# Patient Record
Sex: Male | Born: 1970 | Race: White | Hispanic: No | Marital: Single | Smoking: Current every day smoker
Health system: Southern US, Community
[De-identification: ages and names within clinical notes are randomized; demographics above are authoritative.]

## PROBLEM LIST (undated history)

## (undated) DIAGNOSIS — K633 Ulcer of intestine: Secondary | ICD-10-CM

## (undated) DIAGNOSIS — K529 Noninfective gastroenteritis and colitis, unspecified: Secondary | ICD-10-CM

## (undated) DIAGNOSIS — F419 Anxiety disorder, unspecified: Secondary | ICD-10-CM

---

## 2005-08-26 ENCOUNTER — Emergency Department (HOSPITAL_COMMUNITY): Admission: EM | Admit: 2005-08-26 | Discharge: 2005-08-26 | Payer: Self-pay | Admitting: Family Medicine

## 2005-09-09 ENCOUNTER — Emergency Department (HOSPITAL_COMMUNITY): Admission: EM | Admit: 2005-09-09 | Discharge: 2005-09-09 | Payer: Self-pay | Admitting: Family Medicine

## 2007-05-17 ENCOUNTER — Ambulatory Visit: Payer: Self-pay | Admitting: Gastroenterology

## 2007-05-30 ENCOUNTER — Encounter: Payer: Self-pay | Admitting: Gastroenterology

## 2007-05-30 ENCOUNTER — Ambulatory Visit: Payer: Self-pay | Admitting: Gastroenterology

## 2007-06-29 ENCOUNTER — Ambulatory Visit: Payer: Self-pay | Admitting: Gastroenterology

## 2007-12-17 ENCOUNTER — Emergency Department (HOSPITAL_COMMUNITY): Admission: EM | Admit: 2007-12-17 | Discharge: 2007-12-17 | Payer: Self-pay | Admitting: Emergency Medicine

## 2010-09-21 NOTE — Assessment & Plan Note (Signed)
[facility]                         GASTROENTEROLOGY OFFICE NOTE   ERVING, SASSANO                       MRN:          [id number]  DATE:06/29/2007                            DOB:          1971/04/23    HISTORY OF PRESENT ILLNESS:  Mr. Llamas returns for followup of left  sided ulcerative colitis and GERD. His reflux symptoms are well  controlled on Pantoprazole. He still has frequent, small volume diarrhea  and urgency but the volume of diarrhea has decreased. He notes no  further rectal bleeding.   CURRENT MEDICATIONS:  Listed on the chart, updated, and reviewed.   ALLERGIES:  NO KNOWN DRUG ALLERGIES.   PHYSICAL EXAMINATION:  GENERAL:  No acute distress. He is not re-  examined.  VITAL SIGNS:  Weight 215.4 pounds. Blood pressure 122/80, pulse 72 and  regular.   ASSESSMENT/PLAN:  1. LEFT SIDED ULCERATIVE COLITIS:  Continue Lealda 2.4 grams daily.      Begin Canasa 1000 mg suppositories q.h.s. for the next 4 weeks.      Return office visit in 4 weeks.  2. GASTROESOPHAGEAL REFLUX DISEASE:  Continue standard anti-reflux      measures and Pantoprazole 40 mg p.o. q. a.m.     Venita Lick. Russella Dar, MD, [facility]  Electronically Signed    MTS/MedQ  DD: 06/29/2007  DT: 06/30/2007  Job #: [id number]

## 2010-09-21 NOTE — Assessment & Plan Note (Signed)
[facility]                         GASTROENTEROLOGY OFFICE NOTE   Thomas Castro, Thomas Castro                       MRN:          [id number]  DATE:05/17/2007                            DOB:          08/08/1970    REQUESTING Kayveon Lennartz:  Kristian Covey, P.A.-C.   REASON FOR CONSULTATION:  Alternating diarrhea and constipation,  associated with rectal bleeding.   HISTORY OF PRESENT ILLNESS:  This 40 year old white male who relates a  several-month history of alternating diarrhea and constipation.  He  predominantly has diarrhea.  His symptoms are often exacerbated by meals  and it has been associated with bloating.  He has noted a few episodes  of small amounts of bright red blood per rectum with bowel movements.  He has had worsening problems with anxiety over the same time frame.  He  is now managed with fluvoxamine and Clonazepam with improved control of  his anxiety and also improved his bowel habits.  Around the same time he  developed problems with frequent heartburn and he was started on Zegerid  and his symptoms have responded.  He notes no dysphagia, odynophagia,  weight loss, change in stool caliber, abdominal pain or chest pain.   FAMILY HISTORY:  Mother with colon cancer at age 74.  maternal  grandmother with colon cancer in her 55's.  His brother has colon  polyps.   PAST MEDICAL HISTORY:  1. Anxiety.  2. Hyperlipidemia.  3. Hemorrhoids.   CURRENT MEDICATIONS:  Listed on the chart, updated and reviewed.   ALLERGIES:  No known drug allergies.   SOCIAL HISTORY/REVIEW OF SYSTEMS:  Per the handwritten form.   PHYSICAL EXAMINATION:  GENERAL:  Well-developed and well-nourished, in  no acute distress.  VITAL SIGNS:  Height 5 feet 11 inches, weight 214 pounds, blood pressure  120/90, pulse 80 and regular.  HEENT:  Anicteric sclerae.  Oropharynx clear.  CHEST:  Clear to auscultation bilaterally.  HEART:  A regular rate and rhythm without  murmurs appreciated.  ABDOMEN:  Soft and nontender, with normoactive bowel sounds.  No  palpable organomegaly, masses or herniae.  RECTAL:  Deferred at the time of colonoscopy.  EXTREMITIES:  Without clubbing, cyanosis or edema.  NEUROLOGIC:  Alert and oriented x3.  Grossly nonfocal.   ASSESSMENT/PLAN:  1. Change in bowel habits with alternating diarrhea and constipation      associated with small volume hematochezia.  Strong family history      of colon cancer. His symptoms have substantially improved on      fluvoxamine and clonazepam.  The risks, benefits and alternatives      to colonoscopy and possible biopsy and possible polypectomy were      discussed with the patient and he consents to proceed.  This will      be scheduled electively.  2. Gastroesophageal reflux disease:  Continue anti-reflux measures and      Zegerid q.a.m.  Schedule an upper endoscopy.  The risks, benefits      and alternatives to upper endoscopy discussed with the patient and      he consents to proceed.  This  will be scheduled electively at the      time of his colonoscopy.     Venita Lick. Russella Dar, MD, [facility]  Electronically Signed    MTS/MedQ  DD: 05/29/2007  DT: 05/29/2007  Job #: [id number]   cc:   Kristian Covey, P.A.-C.

## 2011-02-04 LAB — CBC
Hemoglobin: 16.1
RBC: 4.89
RDW: 13.5
WBC: 9.1

## 2011-02-04 LAB — DIFFERENTIAL
Basophils Relative: 1
Eosinophils Absolute: 0
Monocytes Absolute: 0.7
Monocytes Relative: 8
Neutrophils Relative %: 79 — ABNORMAL HIGH

## 2011-02-04 LAB — COMPREHENSIVE METABOLIC PANEL
ALT: 35
Alkaline Phosphatase: 49
Glucose, Bld: 128 — ABNORMAL HIGH
Potassium: 4
Sodium: 141
Total Protein: 6.1

## 2011-10-24 ENCOUNTER — Emergency Department (HOSPITAL_COMMUNITY)
Admission: EM | Admit: 2011-10-24 | Discharge: 2011-10-24 | Disposition: A | Discharge status: Home / Self Care | Payer: 59 | Attending: Emergency Medicine | Admitting: Emergency Medicine

## 2011-10-24 ENCOUNTER — Emergency Department (HOSPITAL_COMMUNITY): Payer: 59

## 2011-10-24 ENCOUNTER — Encounter (HOSPITAL_COMMUNITY): Payer: Self-pay | Admitting: Emergency Medicine

## 2011-10-24 DIAGNOSIS — K5190 Ulcerative colitis, unspecified, without complications: Secondary | ICD-10-CM | POA: Insufficient documentation

## 2011-10-24 DIAGNOSIS — R109 Unspecified abdominal pain: Secondary | ICD-10-CM | POA: Insufficient documentation

## 2011-10-24 DIAGNOSIS — E860 Dehydration: Secondary | ICD-10-CM | POA: Insufficient documentation

## 2011-10-24 DIAGNOSIS — K625 Hemorrhage of anus and rectum: Secondary | ICD-10-CM | POA: Insufficient documentation

## 2011-10-24 HISTORY — DX: Noninfective gastroenteritis and colitis, unspecified: K52.9

## 2011-10-24 HISTORY — DX: Ulcer of intestine: K63.3

## 2011-10-24 LAB — URINALYSIS, ROUTINE W REFLEX MICROSCOPIC
Bilirubin Urine: NEGATIVE
Nitrite: NEGATIVE
Protein, ur: 30 mg/dL — AB
Specific Gravity, Urine: 1.028 (ref 1.005–1.030)
Urobilinogen, UA: 0.2 mg/dL (ref 0.0–1.0)

## 2011-10-24 LAB — URINE MICROSCOPIC-ADD ON

## 2011-10-24 LAB — CBC
HCT: 44.1 % (ref 39.0–52.0)
Hemoglobin: 15.8 g/dL (ref 13.0–17.0)
MCH: 31.8 pg (ref 26.0–34.0)
MCHC: 35.8 g/dL (ref 30.0–36.0)
MCV: 88.7 fL (ref 78.0–100.0)
Platelets: 251 K/uL (ref 150–400)
RBC: 4.97 MIL/uL (ref 4.22–5.81)
RDW: 14 % (ref 11.5–15.5)
WBC: 8.1 K/uL (ref 4.0–10.5)

## 2011-10-24 LAB — BASIC METABOLIC PANEL
BUN: 11 mg/dL (ref 6–23)
CO2: 18 mEq/L — ABNORMAL LOW (ref 19–32)
Calcium: 8.1 mg/dL — ABNORMAL LOW (ref 8.4–10.5)
GFR calc non Af Amer: >90 mL/min (ref >90)
Glucose, Bld: 65 mg/dL — ABNORMAL LOW (ref 70–99)
Potassium: 3.9 mEq/L (ref 3.5–5.1)

## 2011-10-24 LAB — COMPREHENSIVE METABOLIC PANEL
Alkaline Phosphatase: 77 U/L (ref 39–117)
BUN: 13 mg/dL (ref 6–23)
Calcium: 9.1 mg/dL (ref 8.4–10.5)
GFR calc Af Amer: >90 mL/min (ref >90)
GFR calc non Af Amer: >90 mL/min (ref >90)
Glucose, Bld: 87 mg/dL (ref 70–99)
Total Protein: 6.5 g/dL (ref 6.0–8.3)

## 2011-10-24 LAB — LIPASE, BLOOD: Lipase: 21 U/L (ref 11–59)

## 2011-10-24 LAB — DIFFERENTIAL
Basophils Absolute: 0 K/uL (ref 0.0–0.1)
Basophils Relative: 0 % (ref 0–1)
Eosinophils Absolute: 0 K/uL (ref 0.0–0.7)
Eosinophils Relative: 0 % (ref 0–5)
Lymphocytes Relative: 15 % (ref 12–46)
Lymphs Abs: 1.2 K/uL (ref 0.7–4.0)
Monocytes Absolute: 0.6 K/uL (ref 0.1–1.0)
Monocytes Relative: 7 % (ref 3–12)
Neutro Abs: 6.3 K/uL (ref 1.7–7.7)
Neutrophils Relative %: 78 % — ABNORMAL HIGH (ref 43–77)

## 2011-10-24 MED ORDER — HYDROCODONE-ACETAMINOPHEN 5-325 MG PO TABS: 2.0000 | ORAL_TABLET | ORAL | Status: AC | PRN

## 2011-10-24 MED ORDER — HYDROMORPHONE HCL PF 1 MG/ML IJ SOLN
1.0000 mg | Freq: Once | INTRAMUSCULAR | Status: AC
  Administered 2011-10-24: 1 mg via INTRAVENOUS
  Filled 2011-10-24: qty 1

## 2011-10-24 MED ORDER — ONDANSETRON 4 MG PO TBDP: 4.0000 mg | ORAL_TABLET | Freq: Three times a day (TID) | ORAL | Status: AC | PRN

## 2011-10-24 MED ORDER — SODIUM CHLORIDE 0.9 % IV BOLUS (SEPSIS)
1000.0000 mL | Freq: Once | INTRAVENOUS | Status: AC
  Administered 2011-10-24: 1000 mL via INTRAVENOUS

## 2011-10-24 MED ORDER — IOHEXOL 300 MG/ML  SOLN
20.0000 mL | INTRAMUSCULAR | Status: AC
  Administered 2011-10-24 (×2): 20 mL via ORAL

## 2011-10-24 MED ORDER — ONDANSETRON HCL 4 MG/2ML IJ SOLN
4.0000 mg | Freq: Once | INTRAMUSCULAR | Status: AC
  Administered 2011-10-24: 4 mg via INTRAVENOUS
  Filled 2011-10-24: qty 2

## 2011-10-24 MED ORDER — IOHEXOL 300 MG/ML  SOLN
100.0000 mL | Freq: Once | INTRAMUSCULAR | Status: AC | PRN
  Administered 2011-10-24: 100 mL via INTRAVENOUS

## 2011-10-24 NOTE — Discharge Instructions (Signed)
Your abdominal pain and weakness likely came from your ulcerative colitis. You have been given IV fluids while in the ED. It is very important that you follow up with your gastroenterologist for further evaluation and treatment of the UC. Make sure to drink plenty of fluids over the next several days. Return to the ED for worsening or changing condition or new symptoms.  Dehydration, Adult Dehydration is when you lose more fluids from the body than you take in. Vital organs like the kidneys, brain, and heart cannot function without a proper amount of fluids and salt. Any loss of fluids from the body can cause dehydration.  CAUSES   Vomiting.   Diarrhea.   Excessive sweating.   Excessive urine output.   Fever.  SYMPTOMS  Mild dehydration  Thirst.   Dry lips.   Slightly dry mouth.  Moderate dehydration  Very dry mouth.   Sunken eyes.   Skin does not bounce back quickly when lightly pinched and released.   Dark urine and decreased urine production.   Decreased tear production.   Headache.  Severe dehydration  Very dry mouth.   Extreme thirst.   Rapid, weak pulse (more than 100 beats per minute at rest).   Cold hands and feet.   Not able to sweat in spite of heat and temperature.   Rapid breathing.   Blue lips.   Confusion and lethargy.   Difficulty being awakened.   Minimal urine production.   No tears.  DIAGNOSIS  Your caregiver will diagnose dehydration based on your symptoms and your exam. Blood and urine tests will help confirm the diagnosis. The diagnostic evaluation should also identify the cause of dehydration. TREATMENT  Treatment of mild or moderate dehydration can often be done at home by increasing the amount of fluids that you drink. It is best to drink small amounts of fluid more often. Drinking too much at one time can make vomiting worse. Refer to the home care instructions below. Severe dehydration needs to be treated at the hospital where  you will probably be given intravenous (IV) fluids that contain water and electrolytes. HOME CARE INSTRUCTIONS   Ask your caregiver about specific rehydration instructions.   Drink enough fluids to keep your urine clear or pale yellow.   Drink small amounts frequently if you have nausea and vomiting.   Eat as you normally do.   Avoid:   Foods or drinks high in sugar.   Carbonated drinks.   Juice.   Extremely hot or cold fluids.   Drinks with caffeine.   Fatty, greasy foods.   Alcohol.   Tobacco.   Overeating.   Gelatin desserts.   Wash your hands well to avoid spreading bacteria and viruses.   Only take over-the-counter or prescription medicines for pain, discomfort, or fever as directed by your caregiver.   Ask your caregiver if you should continue all prescribed and over-the-counter medicines.   Keep all follow-up appointments with your caregiver.  SEEK MEDICAL CARE IF:  You have abdominal pain and it increases or stays in one area (localizes).   You have a rash, stiff neck, or severe headache.   You are irritable, sleepy, or difficult to awaken.   You are weak, dizzy, or extremely thirsty.  SEEK IMMEDIATE MEDICAL CARE IF:   You are unable to keep fluids down or you get worse despite treatment.   You have frequent episodes of vomiting or diarrhea.   You have blood or green matter (bile) in your vomit.  You have blood in your stool or your stool looks black and tarry.   You have not urinated in 6 to 8 hours, or you have only urinated a small amount of very dark urine.   You have a fever.   You faint.  MAKE SURE YOU:   Understand these instructions.   Will watch your condition.   Will get help right away if you are not doing well or get worse.  Document Released: 04/25/2005 Document Revised: 04/14/2011 Document Reviewed: 12/13/2010 [facility] Patient Information 2012 [place], [place].Bloody Stools Bloody stools often mean that there is a problem  in the digestive tract. Your caregiver may use the term "melena" to describe black, tarry, and bad smelling stools or "hematochezia" to describe red or maroon-colored stools. Blood seen in the stool can be caused by bleeding anywhere along the intestinal tract.  A black stool usually means that blood is coming from the upper part of the gastrointestinal tract (esophagus, stomach, or small bowel). Passing maroon-colored stools or bright red blood usually means that blood is coming from lower down in the large bowel or the rectum. However, sometimes massive bleeding in the stomach or small intestine can cause bright red bloody stools.  Consuming black licorice, lead, iron pills, medicines containing bismuth subsalicylate, or blueberries can also cause black stools. Your caregiver can test black stools to see if blood is present. It is important that the cause of the bleeding be found. Treatment can then be started, and the problem can be corrected. Rectal bleeding may not be serious, but you should not assume everything is okay until you know the cause.It is very important to follow up with your caregiver or a specialist in gastrointestinal problems. CAUSES  Blood in the stools can come from various underlying causes.Often, the cause is not found during your first visit. Testing is often needed to discover the cause of bleeding in the gastrointestinal tract. Causes range from simple to serious or even life-threatening.Possible causes include:  Hemorrhoids.These are veins that are full of blood (engorged) in the rectum. They cause pain, inflammation, and may bleed.   Anal fissures.These are areas of painful tearing which may bleed. They are often caused by passing hard stool.   Diverticulosis.These are pouches that form on the colon over time, with age, and may bleed significantly.   Diverticulitis.This is inflammation in areas with diverticulosis. It can cause pain, fever, and bloody stools,  although bleeding is rare.   Proctitis and colitis. These are inflamed areas of the rectum or colon. They may cause pain, fever, and bloody stools.   Polyps and cancer. Colon cancer is a leading cause of preventable cancer death.It often starts out as precancerous polyps that can be removed during a colonoscopy, preventing progression into cancer. Sometimes, polyps and cancer may cause rectal bleeding.   Gastritis and ulcers.Bleeding from the upper gastrointestinal tract (near the stomach) may travel through the intestines and produce black, sometimes tarry, often bad smelling stools. In certain cases, if the bleeding is fast enough, the stools may not be black, but red and the condition may be life-threatening.  SYMPTOMS  You may have stools that are bright red and bloody, that are normal color with blood on them, or that are dark black and tarry. In some cases, you may only have blood in the toilet bowl. Any of these cases need medical care. You may also have:  Pain at the anus or anywhere in the rectum.   Lightheadedness or feeling faint.  Extreme weakness.   Nausea or vomiting.   Fever.  DIAGNOSIS Your caregiver may use the following methods to find the cause of your bleeding:  Taking a medical history. Age is important. Older people tend to develop polyps and cancer more often. If there is anal pain and a hard, large stool associated with bleeding, a tear of the anus may be the cause. If blood drips into the toilet after a bowel movement, bleeding hemorrhoids may be the problem. The color and frequency of the bleeding are additional considerations. In most cases, the medical history provides clues, but seldom the final answer.   A visual and finger (digital) exam. Your caregiver will inspect the anal area, looking for tears and hemorrhoids. A finger exam can provide information when there is tenderness or a growth inside. In men, the prostate is also examined.   Endoscopy. Several  types of small, long scopes (endoscopes) are used to view the colon.   In the office, your caregiver may use a rigid, or more commonly, a flexible viewing sigmoidoscope. This exam is called flexible sigmoidoscopy. It is performed in 5 to 10 minutes.   A more thorough exam is accomplished with a colonoscope. It allows your caregiver to view the entire 5 to 6 foot long colon. Medicine to help you relax (sedative) is usually given for this exam. Frequently, a bleeding lesion may be present beyond the reach of the sigmoidoscope. So, a colonoscopy may be the best exam to start with. Both exams are usually done on an outpatient basis. This means the patient does not stay overnight in the hospital or surgery center.   An upper endoscopy may be needed to examine your stomach. Sedation is used and a flexible endoscope is put in your mouth, down to your stomach.   A barium enema X-ray. This is an X-ray exam. It uses liquid barium inserted by enema into the rectum. This test alone may not identify an actual bleeding point. X-rays highlight abnormal shadows, such as those made by lumps (tumors), diverticuli, or colitis.  TREATMENT  Treatment depends on the cause of your bleeding.   For bleeding from the stomach or colon, the caregiver doing your endoscopy or colonoscopy may be able to stop the bleeding as part of the procedure.   Inflammation or infection of the colon can be treated with medicines.   Many rectal problems can be treated with creams, suppositories, or warm baths.   Surgery is sometimes needed.   Blood transfusions are sometimes needed if you have lost a lot of blood.   For any bleeding problem, let your caregiver know if you take aspirin or other blood thinners regularly.  HOME CARE INSTRUCTIONS   Take any medicines exactly as prescribed.   Keep your stools soft by eating a diet high in fiber. Prunes (1 to 3 a day) work well for many people.   Drink enough water and fluids to keep  your urine clear or pale yellow.   Take sitz baths if advised. A sitz bath is when you sit in a bathtub with warm water for 10 to 15 minutes to soak, soothe, and cleanse the rectal area.   If enemas or suppositories are advised, be sure you know how to use them. Tell your caregiver if you have problems with this.   Monitor your bowel movements to look for signs of improvement or worsening.  SEEK MEDICAL CARE IF:   You do not improve in the time expected.   Your  condition worsens after initial improvement.   You develop any new symptoms.  SEEK IMMEDIATE MEDICAL CARE IF:   You develop severe or prolonged rectal bleeding.   You vomit blood.   You feel weak or faint.   You have a fever.  MAKE SURE YOU:  Understand these instructions.   Will watch your condition.   Will get help right away if you are not doing well or get worse.  Document Released: 04/15/2002 Document Revised: 04/14/2011 Document Reviewed: 09/10/2010 [facility] Patient Information 2012 [place], [place].

## 2011-10-24 NOTE — ED Notes (Signed)
Pt. Stated, i had blood dark blood in my stool this am and for the last few weeks and my stomach has had this strange gas and I've not even eaten.

## 2011-10-24 NOTE — ED Provider Notes (Signed)
History     CSN: [id number]  Arrival date & time 10/24/11  0906   First MD Initiated Contact with Patient 10/24/11 0935      Chief Complaint  Patient presents with  . Rectal Bleeding    (Consider location/radiation/quality/duration/timing/severity/associated sxs/prior treatment) HPI Comments: Patient presents with dark tarry stools for the past one day. Has a history of ulcerative colitis that has been untreated. He was diagnosed 4 years ago and saw Dr. Russella Dar is not taking any medications and not followed up. He also complains of diffuse crampy abdominal pain that feels like gas. He denies any vomiting but complains of nausea. He has poor by mouth intake and no appetite. Denies any difficulty with urination. Had normal bowel movements up until today.  The history is provided by the patient.    Past Medical History  Diagnosis Date  . Ulcer, colon   . Colitis     History reviewed. No pertinent past surgical history.  No family history on file.  History  Substance Use Topics  . Smoking status: Current Everyday Smoker  . Smokeless tobacco: Not on file  . Alcohol Use: 1.2 oz/week    2 Cans of beer per week      Review of Systems  Constitutional: Positive for activity change, appetite change and fatigue. Negative for fever.  HENT: Negative for congestion and rhinorrhea.   Respiratory: Negative for cough, chest tightness and shortness of breath.   Cardiovascular: Negative for chest pain.  Gastrointestinal: Positive for nausea, abdominal pain, diarrhea, blood in stool and hematochezia. Negative for vomiting.  Genitourinary: Negative for dysuria.  Musculoskeletal: Negative for back pain.  Skin: Negative for rash.  Neurological: Negative for dizziness and light-headedness.    Allergies  Review of patient's allergies indicates no known allergies.  Home Medications   Current Outpatient Rx  Name Route Sig Dispense Refill  . ADULT MULTIVITAMIN W/MINERALS CH Oral Take 1  tablet by mouth daily.    . ST JOHNS WORT PO Oral Take 1 tablet by mouth daily.      BP 158/94  Pulse 86  Temp 98.7 F (37.1 C) (Oral)  Resp 16  SpO2 97%  Physical Exam  Constitutional: He is oriented to person, place, and time. He appears well-developed and well-nourished. He appears distressed.       Uncomfortable  HENT:  Head: Normocephalic and atraumatic.  Mouth/Throat: Oropharynx is clear and moist. No oropharyngeal exudate.  Eyes: Conjunctivae are normal. Pupils are equal, round, and reactive to light.  Neck: Normal range of motion. Neck supple.  Cardiovascular: Normal rate, regular rhythm and normal heart sounds.   No murmur heard. Pulmonary/Chest: Effort normal and breath sounds normal. No respiratory distress.  Abdominal: Soft. There is tenderness. There is no rebound and no guarding.  Genitourinary: Guaiac positive stool.       No hemorrhoids, no fissures  Musculoskeletal: Normal range of motion. He exhibits no edema and no tenderness.  Neurological: He is alert and oriented to person, place, and time. No cranial nerve deficit.  Skin: Skin is warm.    ED Course  Procedures (including critical care time)  Labs Reviewed  DIFFERENTIAL - Abnormal; Notable for the following:    Neutrophils Relative 78 (*)     All other components within normal limits  COMPREHENSIVE METABOLIC PANEL - Abnormal; Notable for the following:    CO2 16 (*)     AST 55 (*)     All other components within normal limits  URINALYSIS, ROUTINE  W REFLEX MICROSCOPIC - Abnormal; Notable for the following:    Ketones, ur >80 (*)     Protein, ur 30 (*)     All other components within normal limits  BASIC METABOLIC PANEL - Abnormal; Notable for the following:    CO2 18 (*)     Glucose, Bld 65 (*)     Calcium 8.1 (*)     All other components within normal limits  CBC  LIPASE, BLOOD  OCCULT BLOOD, POC DEVICE  URINE MICROSCOPIC-ADD ON  OCCULT BLOOD X 1 CARD TO LAB, STOOL   Ct Abdomen Pelvis W  Contrast  10/24/2011  *RADIOLOGY REPORT*  Clinical Data: Rectal bleeding.  History of ulcerative colitis, diagnosed on endo.  CT ABDOMEN AND PELVIS WITH CONTRAST  Technique:  Multidetector CT imaging of the abdomen and pelvis was performed following the standard protocol during bolus administration of intravenous contrast.  Contrast: OMNIPAQUE IOHEXOL 300 MG/ML  SOLN  Comparison: CT of the abdomen and pelvis 12/17/2007  Findings:  At the lung bases are unremarkable.  No focal abnormality identified within the liver, spleen, pancreas, adrenal glands, or kidneys.  The gallbladder is present.  The stomach and small bowel loops have a normal appearance. The appendix is well seen and has a normal appearance.  The colonic loops are incompletely distended but have a normal CT appearance. No inflammation or colonic mass identified.  No retroperitoneal or mesenteric adenopathy. No evidence for aortic aneurysm.  Degenerative changes are seen in the lumbar spine.  IMPRESSION: No evidence for acute abnormality of the abdomen pelvis.  Original Report Authenticated By: Patterson Hammersmith, M.D.     1. Dehydration   2. Rectal bleeding   3. Ulcerative colitis       MDM  Untreated ulcerative colitis with dark stools and diffuse crampy abdominal pain. Abdomen soft and nonsurgical. Mild tachycardia and discomfort.  IVF, labs, orthostatics, symptom control.  CT a/p given history of UC that is untreated.  Care discussed with [facility].       Glynn Octave, MD 10/24/11 1622

## 2011-10-24 NOTE — ED Provider Notes (Signed)
11:49 AM Care assumed of pt in CDU from Dr. Manus Gunning. Pt with hx of ulcerative colitis presented with bloody stools. He is hemoccult positive here and noted to be orthostatic.  Plan: -CT abd/pelvis -IVF to correct orthostasis -recheck BMP to ensure upward trend of bicarb  1:12 PM CT abd/pel with no acute findings. Pt reports that he continues to feel "weak." I explained to him that he is likely dehydrated given the positive orthostatics and >80 ketones in urine. We will continue to rehydrate and treat pain. Pt agreeable with this plan.  3:00 PM Pt remains stable with no c/o at this time.  6:00 PM Pt's bicarb is improved from previous. No longer orthostatic. He is feeling better and wishes to be discharged home. Emphasized importance of f/u with GI MD for further evaluation and treatment. Return precautions discussed.  Grant Fontana, PA-C 10/24/11 2008

## 2011-10-25 NOTE — ED Provider Notes (Signed)
Medical screening examination/treatment/procedure(s) were performed by non-physician practitioner and as supervising physician I was immediately available for consultation/collaboration.  Laelle Bridgett L Madine Sarr, MD 10/25/11 2019 

## 2012-04-10 ENCOUNTER — Encounter: Payer: Self-pay | Admitting: Gastroenterology

## 2013-01-01 ENCOUNTER — Encounter: Payer: Self-pay | Admitting: Gastroenterology

## 2013-01-14 IMAGING — CT CT ABD-PELV W/ CM
2 of 5 series · 16 of 46 positions shown, 18 images · IV contrast (APPLIED)
Comparison: CT of the abdomen and pelvis 12/17/2007

CLINICAL DATA: Rectal bleeding.  History of ulcerative colitis,
diagnosed on endo.

CT ABDOMEN AND PELVIS WITH CONTRAST
TECHNIQUE: Multidetector CT imaging of the abdomen and pelvis was
performed following the standard protocol during bolus
administration of intravenous contrast.
Contrast: 100mL OMNIPAQUE IOHEXOL 300 MG/ML  SOLN

[Series 2: abd/pelv with 5.0 b31f st · axial · 0.73mm/px · z∈[-496,-66]mm · 13 of 98 slices shown, 15 images]
[im 6/98  soft-tissue]
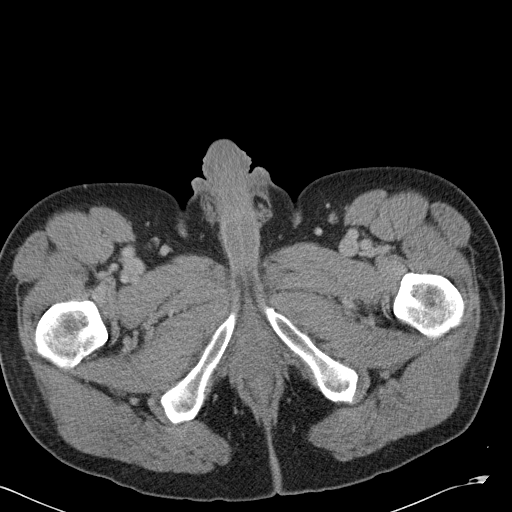
[im 6/98  bone]
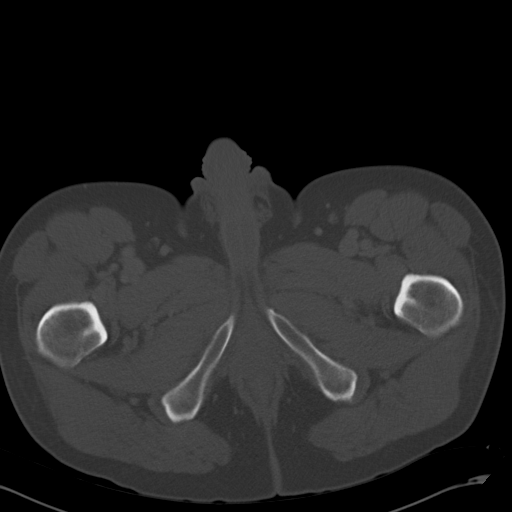
[im 11/98  soft-tissue]
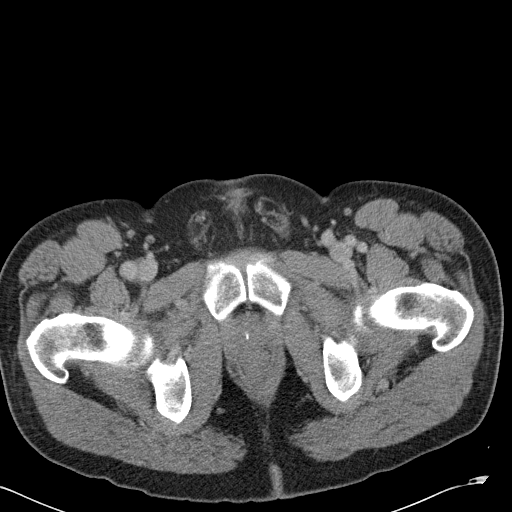
[im 22/98  soft-tissue]
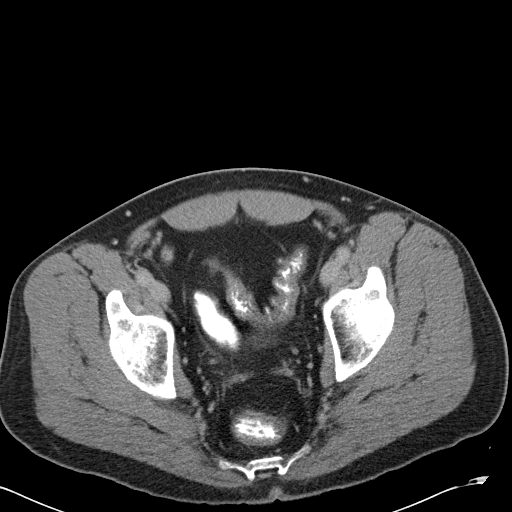
[im 27/98  soft-tissue]
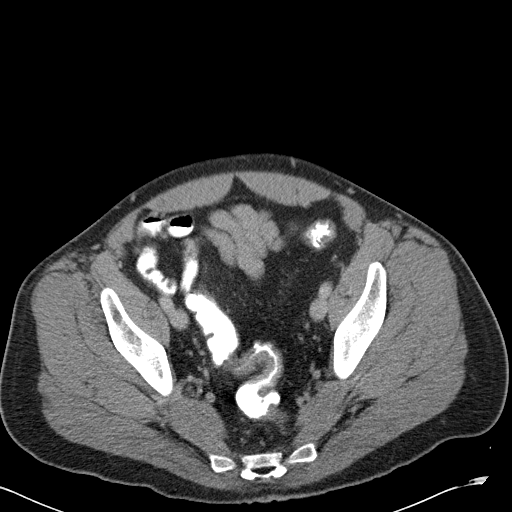
[im 33/98  soft-tissue]
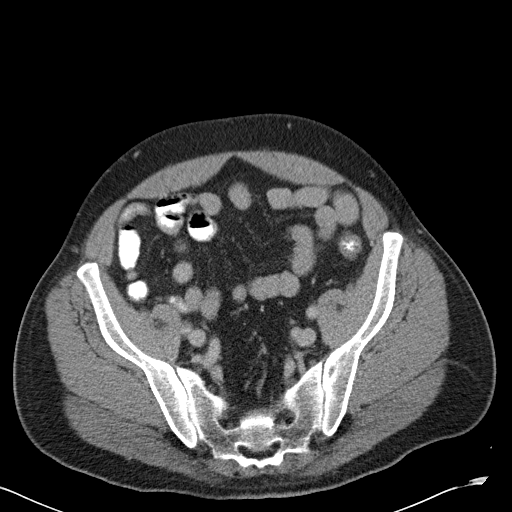
[im 44/98  soft-tissue]
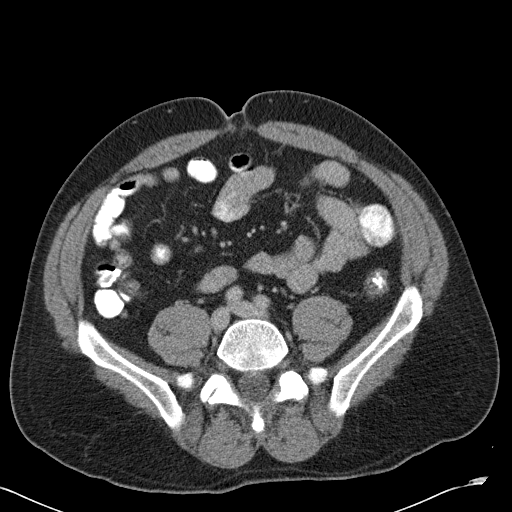
[im 49/98  soft-tissue]
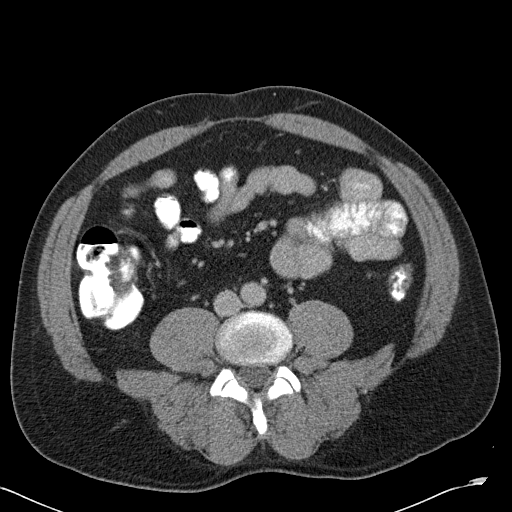
[im 54/98  soft-tissue]
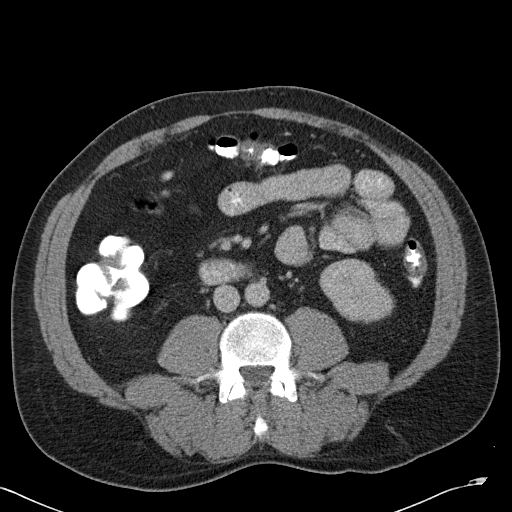
[im 65/98  soft-tissue]
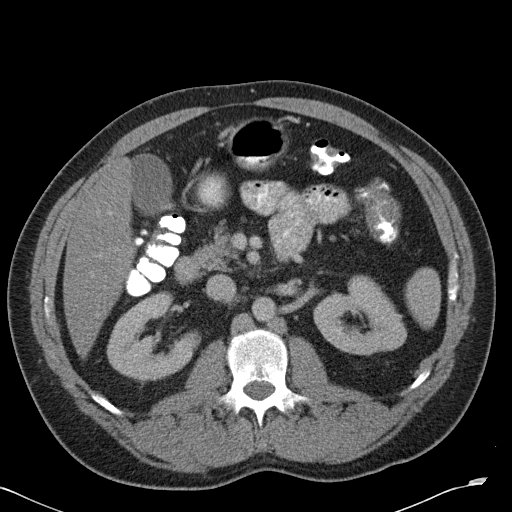
[im 65/98  bone]
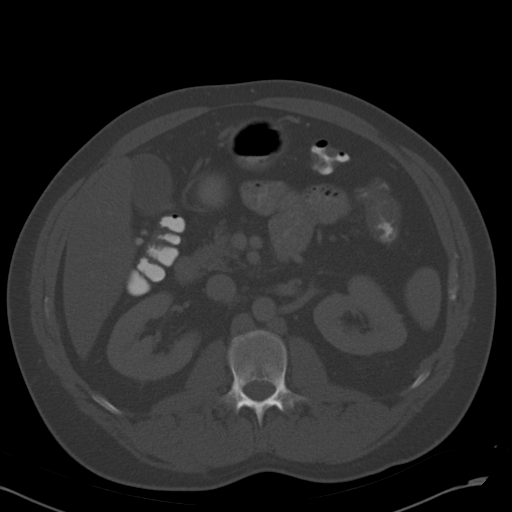
[im 71/98  soft-tissue]
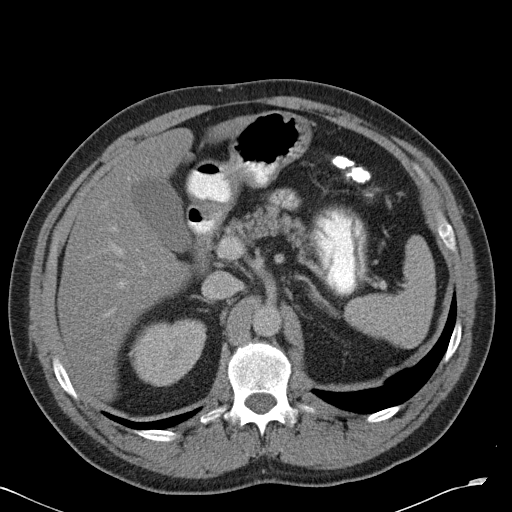
[im 76/98  soft-tissue]
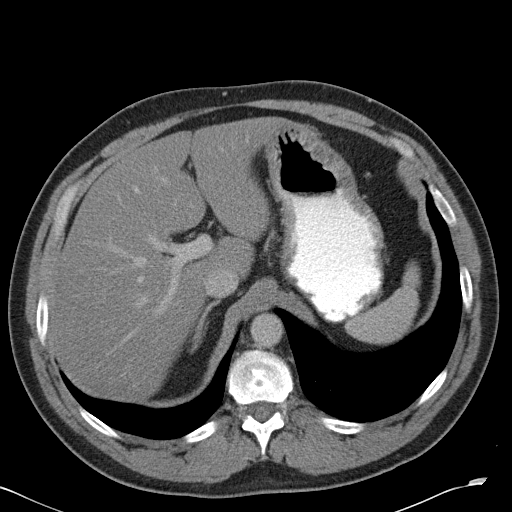
[im 87/98  soft-tissue]
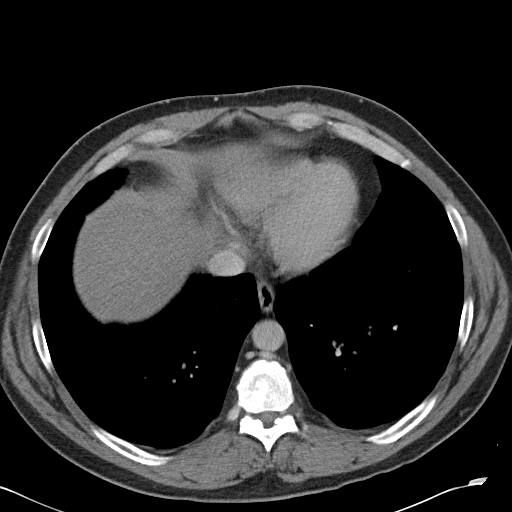
[im 92/98  soft-tissue]
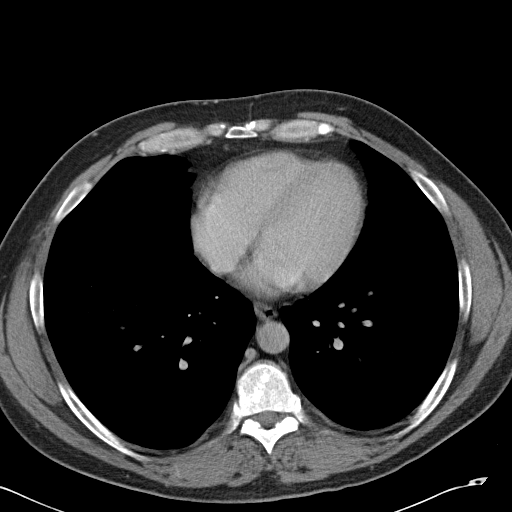

[Series 5: coronals · coronal · 0.66mm/px · 3 of 141 slices shown]
[im 47/141  soft-tissue]
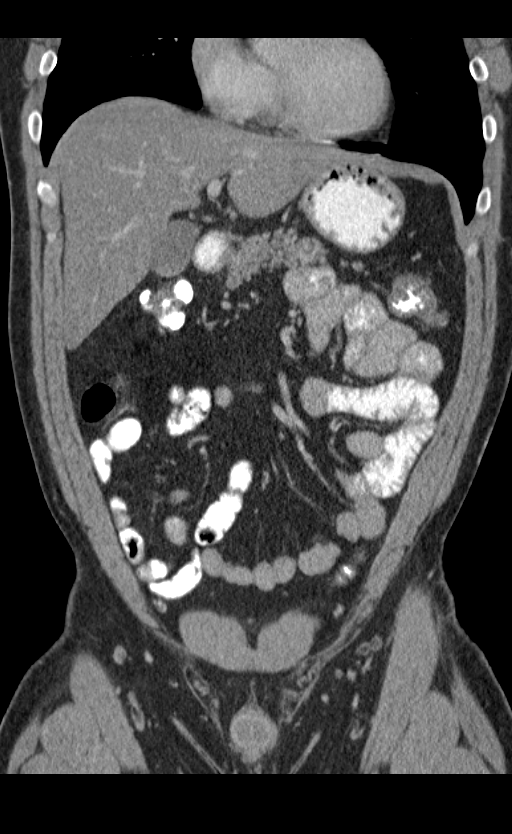
[im 63/141  soft-tissue]
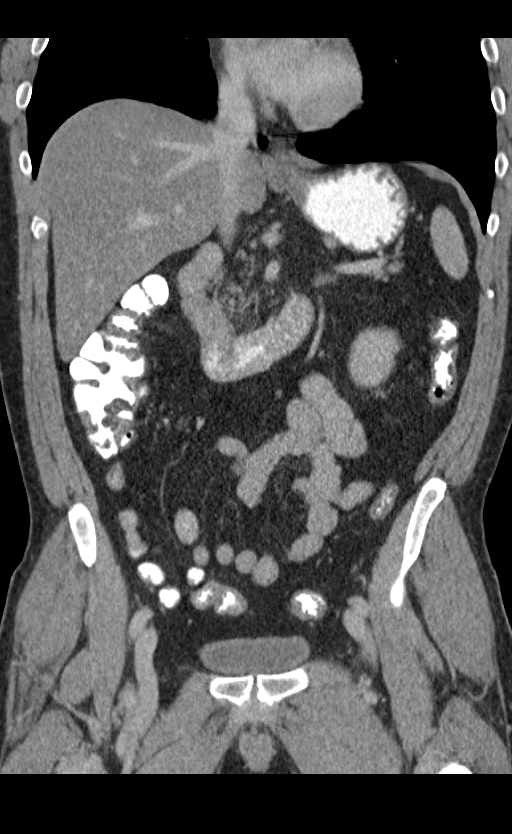
[im 78/141  soft-tissue]
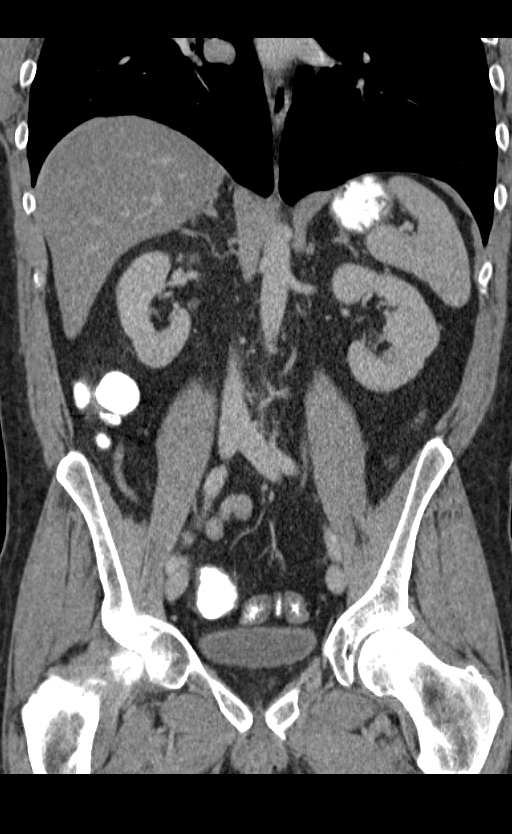

[16 of 46 positions shown; findings below may reference images not displayed]

FINDINGS: At the lung bases are unremarkable.  No focal abnormality
identified within the liver, spleen, pancreas, adrenal glands, or
kidneys.  The gallbladder is present.

The stomach and small bowel loops have a normal appearance. The
appendix is well seen and has a normal appearance.  The colonic
loops are incompletely distended but have a normal CT appearance.
No inflammation or colonic mass identified.

No retroperitoneal or mesenteric adenopathy. No evidence for aortic
aneurysm.

Degenerative changes are seen in the lumbar spine.
IMPRESSION: No evidence for acute abnormality of the abdomen pelvis.

## 2015-10-20 ENCOUNTER — Emergency Department (HOSPITAL_COMMUNITY)
Admission: EM | Admit: 2015-10-20 | Discharge: 2015-10-21 | Disposition: A | Discharge status: Home / Self Care | Payer: 59 | Attending: Emergency Medicine | Admitting: Emergency Medicine

## 2015-10-20 ENCOUNTER — Encounter (HOSPITAL_COMMUNITY): Payer: Self-pay | Admitting: Emergency Medicine

## 2015-10-20 DIAGNOSIS — R748 Abnormal levels of other serum enzymes: Secondary | ICD-10-CM

## 2015-10-20 DIAGNOSIS — F17200 Nicotine dependence, unspecified, uncomplicated: Secondary | ICD-10-CM | POA: Insufficient documentation

## 2015-10-20 DIAGNOSIS — R5383 Other fatigue: Secondary | ICD-10-CM | POA: Diagnosis not present

## 2015-10-20 DIAGNOSIS — K921 Melena: Secondary | ICD-10-CM | POA: Diagnosis present

## 2015-10-20 HISTORY — DX: Anxiety disorder, unspecified: F41.9

## 2015-10-20 LAB — COMPREHENSIVE METABOLIC PANEL
ALT: 97 U/L — ABNORMAL HIGH (ref 17–63)
AST: 308 U/L — AB (ref 15–41)
Albumin: 3.2 g/dL — ABNORMAL LOW (ref 3.5–5.0)
Alkaline Phosphatase: 93 U/L (ref 38–126)
Anion gap: 11 (ref 5–15)
BUN: 18 mg/dL (ref 6–20)
CHLORIDE: 103 mmol/L (ref 101–111)
CO2: 22 mmol/L (ref 22–32)
Calcium: 8.4 mg/dL — ABNORMAL LOW (ref 8.9–10.3)
Creatinine, Ser: 0.98 mg/dL (ref 0.61–1.24)
Glucose, Bld: 128 mg/dL — ABNORMAL HIGH (ref 65–99)
POTASSIUM: 3.6 mmol/L (ref 3.5–5.1)
SODIUM: 136 mmol/L (ref 135–145)
Total Bilirubin: 0.7 mg/dL (ref 0.3–1.2)
Total Protein: 5.4 g/dL — ABNORMAL LOW (ref 6.5–8.1)

## 2015-10-20 LAB — CBC
HEMATOCRIT: 43.7 % (ref 39.0–52.0)
Hemoglobin: 14.9 g/dL (ref 13.0–17.0)
MCH: 32.7 pg (ref 26.0–34.0)
MCHC: 34.1 g/dL (ref 30.0–36.0)
MCV: 95.8 fL (ref 78.0–100.0)
Platelets: 190 10*3/uL (ref 150–400)
RBC: 4.56 MIL/uL (ref 4.22–5.81)
RDW: 14.5 % (ref 11.5–15.5)
WBC: 3.6 10*3/uL — AB (ref 4.0–10.5)

## 2015-10-20 LAB — ABO/RH: ABO/RH(D): A NEG

## 2015-10-20 LAB — TYPE AND SCREEN
ABO/RH(D): A NEG
Antibody Screen: NEGATIVE

## 2015-10-20 NOTE — ED Notes (Signed)
Pt. reports black stools today with generalized weakness , fatigue and mild dizziness onset today .

## 2015-10-20 NOTE — ED Provider Notes (Signed)
CSN: [id number]     Arrival date & time 10/20/15  1855 History   First MD Initiated Contact with Patient 10/20/15 2333     Chief Complaint  Patient presents with  . Melena  . Fatigue     (Consider location/radiation/quality/duration/timing/severity/associated sxs/prior Treatment) HPI   Patient to the ER with multiple medical complaints. He has a PMH of colitis and anxiety. He complains of weight loss 15 lbs in past month, fatigue, black stools off and on, diarrhea (chronic), palpitations, swelling in his ankles: past year, nightmares and decreased sleep, GERD, hemoptysis all for the past month. And admits to alcohol 3 whisky drinks per day.   Overall the patients biggest complaint is that he feels tired and nauseous as well as intermittent melena. No BRB per rectum or active bleeding. Patient multiple times denied that he has any abdominal pain at any point.  Past Medical History  Diagnosis Date  . Ulcer, colon   . Colitis   . Anxiety    History reviewed. No pertinent past surgical history. No family history on file. Social History  Substance Use Topics  . Smoking status: Current Every Day Smoker  . Smokeless tobacco: None  . Alcohol Use: Yes    Review of Systems  Review of Systems All other systems negative except as documented in the HPI. All pertinent positives and negatives as reviewed in the HPI.   Allergies  Review of patient's allergies indicates no known allergies.  Home Medications   Prior to Admission medications   Medication Sig Start Date End Date Taking? Authorizing Provider  [facility] PO Take 1 tablet by mouth daily.   Yes Historical Provider, MD  ondansetron (ZOFRAN) 4 MG tablet Take 1 tablet (4 mg total) by mouth every 6 (six) hours. 10/21/15   Namiyah Grantham Neva SeatGreene, PA-C   BP 146/89 mmHg  Pulse 86  Temp(Src) 98.4 F (36.9 C) (Oral)  Resp 16  Ht 6' (1.829 m)  Wt 83.008 kg  BMI 24.81 kg/m2  SpO2 94% Physical Exam  Constitutional: He appears  well-developed and well-nourished. No distress.  HENT:  Head: Normocephalic and atraumatic.  Right Ear: Tympanic membrane and ear canal normal.  Left Ear: Tympanic membrane and ear canal normal.  Nose: Nose normal.  Mouth/Throat: Uvula is midline, oropharynx is clear and moist and mucous membranes are normal.  Eyes: Pupils are equal, round, and reactive to light.  Neck: Normal range of motion. Neck supple.  Cardiovascular: Normal rate and regular rhythm.   Pulmonary/Chest: Effort normal.  Abdominal: Soft. Bowel sounds are normal. There is no tenderness. There is no rigidity, no rebound, no guarding and no CVA tenderness.  No signs of abdominal distention  Genitourinary: Guaiac positive stool (no stool on glove during rectal exam).  Musculoskeletal:  No LE swelling  Neurological: He is alert.  Acting at baseline  Skin: Skin is warm and dry. No rash noted.  Nursing note and vitals reviewed.   ED Course  Procedures (including critical care time) Labs Review Labs Reviewed  COMPREHENSIVE METABOLIC PANEL - Abnormal; Notable for the following:    Glucose, Bld 128 (*)    Calcium 8.4 (*)    Total Protein 5.4 (*)    Albumin 3.2 (*)    AST 308 (*)    ALT 97 (*)    All other components within normal limits  CBC - Abnormal; Notable for the following:    WBC 3.6 (*)    All other components within normal limits  POC  OCCULT BLOOD, ED - Abnormal; Notable for the following:    Fecal Occult Bld POSITIVE (*)    All other components within normal limits  TYPE AND SCREEN  ABO/RH    Imaging Review Dg Abd Acute W/chest  10/21/2015  CLINICAL DATA:  Acute onset of black stools and generalized weakness. Fatigue and dizziness. Initial encounter. EXAM: DG ABDOMEN ACUTE W/ 1V CHEST COMPARISON:  CT of the abdomen and pelvis from 10/24/2011 FINDINGS: The lungs are well-aerated and clear. There is no evidence of focal opacification, pleural effusion or pneumothorax. The cardiomediastinal silhouette is  within normal limits. The visualized bowel gas pattern is unremarkable. Scattered stool and air are seen within the colon; there is no evidence of small bowel dilatation to suggest obstruction. No free intra-abdominal air is identified on the provided upright view. No acute osseous abnormalities are seen; the sacroiliac joints are unremarkable in appearance. IMPRESSION: Unremarkable bowel gas pattern; no free intra-abdominal air seen. Small to moderate amount of stool noted in the colon. Electronically Signed   By: Roanna Raider M.D.   On: 10/21/2015 01:30   I have personally reviewed and evaluated these images and lab results as part of my medical decision-making.   EKG Interpretation None      MDM   Final diagnoses:  Elevated liver enzymes  Melena  Other fatigue    Patient started to have some nausea, vomiting and shaking. Concern for possible ETOH withdrawal symptoms but the patient denies strongly that he is a daily drinker. His LFTs suggest otherwise however, will refer him to [facility] for evaluation of elevated liver enzymes and positive hemoccult.  Otherwise his acute abd w/ chest, CBC are largely unremarkable. He was given fluids and Ativan and is feeling better.  Patient has been given very strict return to ED precautions, he does not have a PCP but I have found no reason for admission, he is well appearing with normal vital signs.   Medications  sodium chloride 0.9 % bolus 1,000 mL (0 mLs Intravenous Stopped 10/21/15 0309)  ondansetron (ZOFRAN) injection 4 mg (4 mg Intravenous Given 10/21/15 0153)  LORazepam (ATIVAN) injection 1 mg (1 mg Intravenous Given 10/21/15 0210)   I discussed results, diagnoses and plan with Chales Salmon. He has voiced his understanding and questions were answered. We discussed follow-up recommendations and return precautions.     Marlon Pel, PA-C 10/21/15 1610  Gilda Crease, MD 10/21/15 [phone]

## 2015-10-21 ENCOUNTER — Emergency Department (HOSPITAL_COMMUNITY): Payer: 59

## 2015-10-21 LAB — POC OCCULT BLOOD, ED: Fecal Occult Bld: POSITIVE — AB

## 2015-10-21 MED ORDER — SODIUM CHLORIDE 0.9 % IV BOLUS (SEPSIS)
1000.0000 mL | Freq: Once | INTRAVENOUS | Status: AC
  Administered 2015-10-21: 1000 mL via INTRAVENOUS

## 2015-10-21 MED ORDER — LORAZEPAM 2 MG/ML IJ SOLN
1.0000 mg | Freq: Once | INTRAMUSCULAR | Status: AC
  Administered 2015-10-21: 1 mg via INTRAVENOUS
  Filled 2015-10-21: qty 1

## 2015-10-21 MED ORDER — ONDANSETRON HCL 4 MG PO TABS: 4.0000 mg | ORAL_TABLET | Freq: Four times a day (QID) | ORAL | Status: AC

## 2015-10-21 MED ORDER — ONDANSETRON HCL 4 MG/2ML IJ SOLN
4.0000 mg | Freq: Once | INTRAMUSCULAR | Status: AC
  Administered 2015-10-21: 4 mg via INTRAVENOUS
  Filled 2015-10-21: qty 2

## 2015-10-21 NOTE — Discharge Instructions (Signed)
Gastrointestinal Bleeding Gastrointestinal (GI) bleeding means there is bleeding somewhere along the digestive tract, between the mouth and anus. CAUSES  There are many different problems that can cause GI bleeding. Possible causes include:  Esophagitis. This is inflammation, irritation, or swelling of the esophagus.  Hemorrhoids.These are veins that are full of blood (engorged) in the rectum. They cause pain, inflammation, and may bleed.  Anal fissures.These are areas of painful tearing which may bleed. They are often caused by passing hard stool.  Diverticulosis.These are pouches that form on the colon over time, with age, and may bleed significantly.  Diverticulitis.This is inflammation in areas with diverticulosis. It can cause pain, fever, and bloody stools, although bleeding is rare.  Polyps and cancer. Colon cancer often starts out as precancerous polyps.  Gastritis and ulcers.Bleeding from the upper gastrointestinal tract (near the stomach) may travel through the intestines and produce black, sometimes tarry, often bad smelling stools. In certain cases, if the bleeding is fast enough, the stools may not be black, but red. This condition may be life-threatening. SYMPTOMS   Vomiting bright red blood or material that looks like coffee grounds.  Bloody, black, or tarry stools. DIAGNOSIS  Your caregiver may diagnose your condition by taking your history and performing a physical exam. More tests may be needed, including:  X-rays and other imaging tests.  Esophagogastroduodenoscopy (EGD). This test uses a flexible, lighted tube to look at your esophagus, stomach, and small intestine.  Colonoscopy. This test uses a flexible, lighted tube to look at your colon. TREATMENT  Treatment depends on the cause of your bleeding.   For bleeding from the esophagus, stomach, small intestine, or colon, the caregiver doing your EGD or colonoscopy may be able to stop the bleeding as  part of the procedure.  Inflammation or infection of the colon can be treated with medicines.  Many rectal problems can be treated with creams, suppositories, or warm baths.  Surgery is sometimes needed.  Blood transfusions are sometimes needed if you have lost a lot of blood. If bleeding is slow, you may be allowed to go home. If there is a lot of bleeding, you will need to stay in the hospital for observation. HOME CARE INSTRUCTIONS   Take any medicines exactly as prescribed.  Keep your stools soft by eating foods that are high in fiber. These foods include whole grains, legumes, fruits, and vegetables. Prunes (1 to 3 a day) work well for many people.  Drink enough fluids to keep your urine clear or pale yellow. SEEK IMMEDIATE MEDICAL CARE IF:   Your bleeding increases.  You feel lightheaded, weak, or you faint.  You have severe cramps in your back or abdomen.  You pass large blood clots in your stool.  Your problems are getting worse. MAKE SURE YOU:   Understand these instructions.  Will watch your condition.  Will get help right away if you are not doing well or get worse.   This information is not intended to replace advice given to you by your health care provider. Make sure you discuss any questions you have with your health care provider.   Document Released: 04/22/2000 Document Revised: 04/11/2012 Document Reviewed: 10/13/2014 Elsevier Interactive Patient Education 2016 [employer]    Fatigue Fatigue is feeling tired all of the time, a lack of energy, or a lack of motivation. Occasional or mild fatigue is often a normal response to activity or life in general. However, long-lasting (chronic) or extreme fatigue may indicate  an underlying medical condition. HOME CARE INSTRUCTIONS  Watch your fatigue for any changes. The following actions may help to lessen any discomfort you are feeling:  Talk to your health care provider about how much sleep you need each  night. Try to get the required amount every night.  Take medicines only as directed by your health care provider.  Eat a healthy and nutritious diet. Ask your health care provider if you need help changing your diet.  Drink enough fluid to keep your urine clear or pale yellow.  Practice ways of relaxing, such as yoga, meditation, massage therapy, or acupuncture.  Exercise regularly.   Change situations that cause you stress. Try to keep your work and personal routine reasonable.  Do not abuse illegal drugs.  Limit alcohol intake to no more than 1 drink per day for nonpregnant women and 2 drinks per day for men. One drink equals 12 ounces of beer, 5 ounces of wine, or 1 ounces of hard liquor.  Take a multivitamin, if directed by your health care provider. SEEK MEDICAL CARE IF:   Your fatigue does not get better.  You have a fever.   You have unintentional weight loss or gain.  You have headaches.   You have difficulty:   Falling asleep.  Sleeping throughout the night.  You feel angry, guilty, anxious, or sad.   You are unable to have a bowel movement (constipation).   You skin is dry.   Your legs or another part of your body is swollen.  SEEK IMMEDIATE MEDICAL CARE IF:   You feel confused.   Your vision is blurry.  You feel faint or pass out.   You have a severe headache.   You have severe abdominal, pelvic, or back pain.   You have chest pain, shortness of breath, or an irregular or fast heartbeat.   You are unable to urinate or you urinate less than normal.   You develop abnormal bleeding, such as bleeding from the rectum, vagina, nose, lungs, or nipples.  You vomit blood.   You have thoughts about harming yourself or committing suicide.   You are worried that you might harm someone else.    This information is not intended to replace advice given to you by your health care provider. Make sure you discuss any questions you have  with your health care provider.   Document Released: 02/20/2007 Document Revised: 05/16/2014 Document Reviewed: 08/27/2013 Elsevier Interactive Patient Education 2016 [employer] Inc[employer]              Alcoholic Liver Disease The liver is an organ that converts food into energy, absorbs vitamins from food, removes toxins from the blood, and makes proteins. Alcoholic liver disease happens when the liver becomes damaged due to alcohol consumption and it stops working properly. CAUSES This condition is caused by drinking too much alcohol over a number of years. RISK FACTORS This condition is more likely to develop in:  Women.  People who have a family history of the disease.  People who have poor nutrition.  People who are obese. SYMPTOMS Early symptoms of this condition include:  Fatigue.  Weakness.  Abdominal discomfort on the upper right side. Symptoms of moderate disease include:  Fever.  Yellow, pale, or darkening skin.  Abdominal pain.  Nausea and vomiting.  Weight loss.  Loss of appetite. Symptoms of advanced disease include:  Abdominal swelling.  Nosebleeds or bleeding gums.  Itchy skin.  Enlarged fingertips (clubbing).  Light-colored stools that smell  very bad (steatorrhea).  Mood changes.  Feeling agitated.  Confusion.  Trouble concentrating. Some people do not have symptoms until the condition becomes severe. Symptoms are often worse after heavy drinking. DIAGNOSIS This condition is diagnosed with:  A physical exam.  Blood tests.  Taking a sample of liver tissue to examine under a microscope (liver biopsy). You may also have other tests, including:   X-rays.  Ultrasound. TREATMENT Treatment may include:  Stopping alcohol use to allow the liver to heal.  Joining a support group or meeting with a counselor.  Medicines to reduce inflammation. These may be recommended if the disease is severe.  A liver transplant. This is the  only treatment if the disease is very severe.  Nutritional therapy. This may involve:  Taking vitamins.  Eating foods that are high in thiamine, such as whole-wheat cereals, pork, and raw vegetables.  Eating foods that have a lot of folic acid, such as vegetables, fruits, meats, beans, nuts, and dairy foods.  Eating a diet that includes carbohydrate-rich foods, such as yogurt, beans, potatoes, and rice. HOME CARE INSTRUCTIONS  Do not drink alcohol.  Take medicines only as directed by your health care provider.  Take vitamins only as directed by your health care provider.  Follow any diet instructions that are given to you by your health care provider. SEEK MEDICAL CARE IF:  You have a fever.  You have shortness of breath or have difficulty breathing.  You have bright red blood in your stool, or you have black, tarry stools.  You are vomiting blood.  Your skin color becomes more yellow, pale, or dark.  You develop headaches.  You have trouble thinking.  You have problems balancing or walking.   This information is not intended to replace advice given to you by your health care provider. Make sure you discuss any questions you have with your health care provider.   Document Released: 05/16/2014 Document Reviewed: 05/16/2014 Elsevier Interactive Patient Education [employer]

## 2015-10-21 NOTE — ED Notes (Signed)
Hemoccult positive result reported to T. Neva SeatGreene PA.

## 2015-10-21 NOTE — ED Notes (Signed)
Patient transported to X-ray 

## 2017-01-11 IMAGING — DX DG ABDOMEN ACUTE W/ 1V CHEST
3 series · 3 of 3 positions shown · non-contrast
Comparison: CT of the abdomen and pelvis from 10/24/2011

CLINICAL DATA: Acute onset of black stools and generalized
weakness. Fatigue and dizziness. Initial encounter.

EXAM:
DG ABDOMEN ACUTE W/ 1V CHEST

[chest pa]
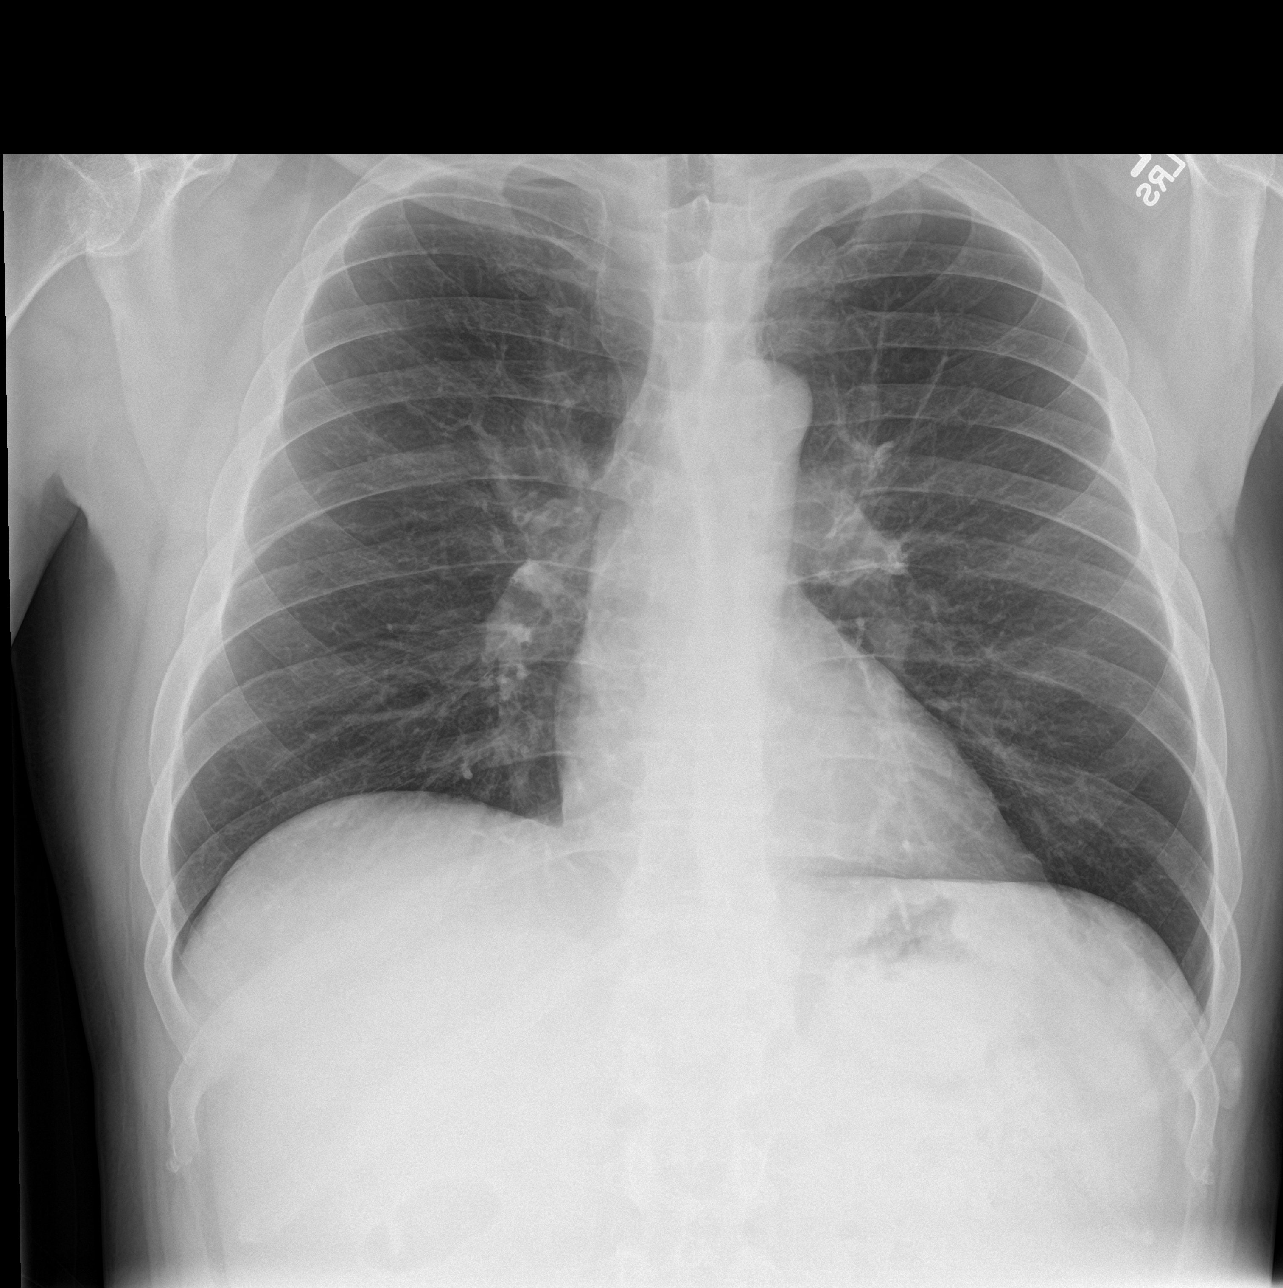

[abdomen erect]
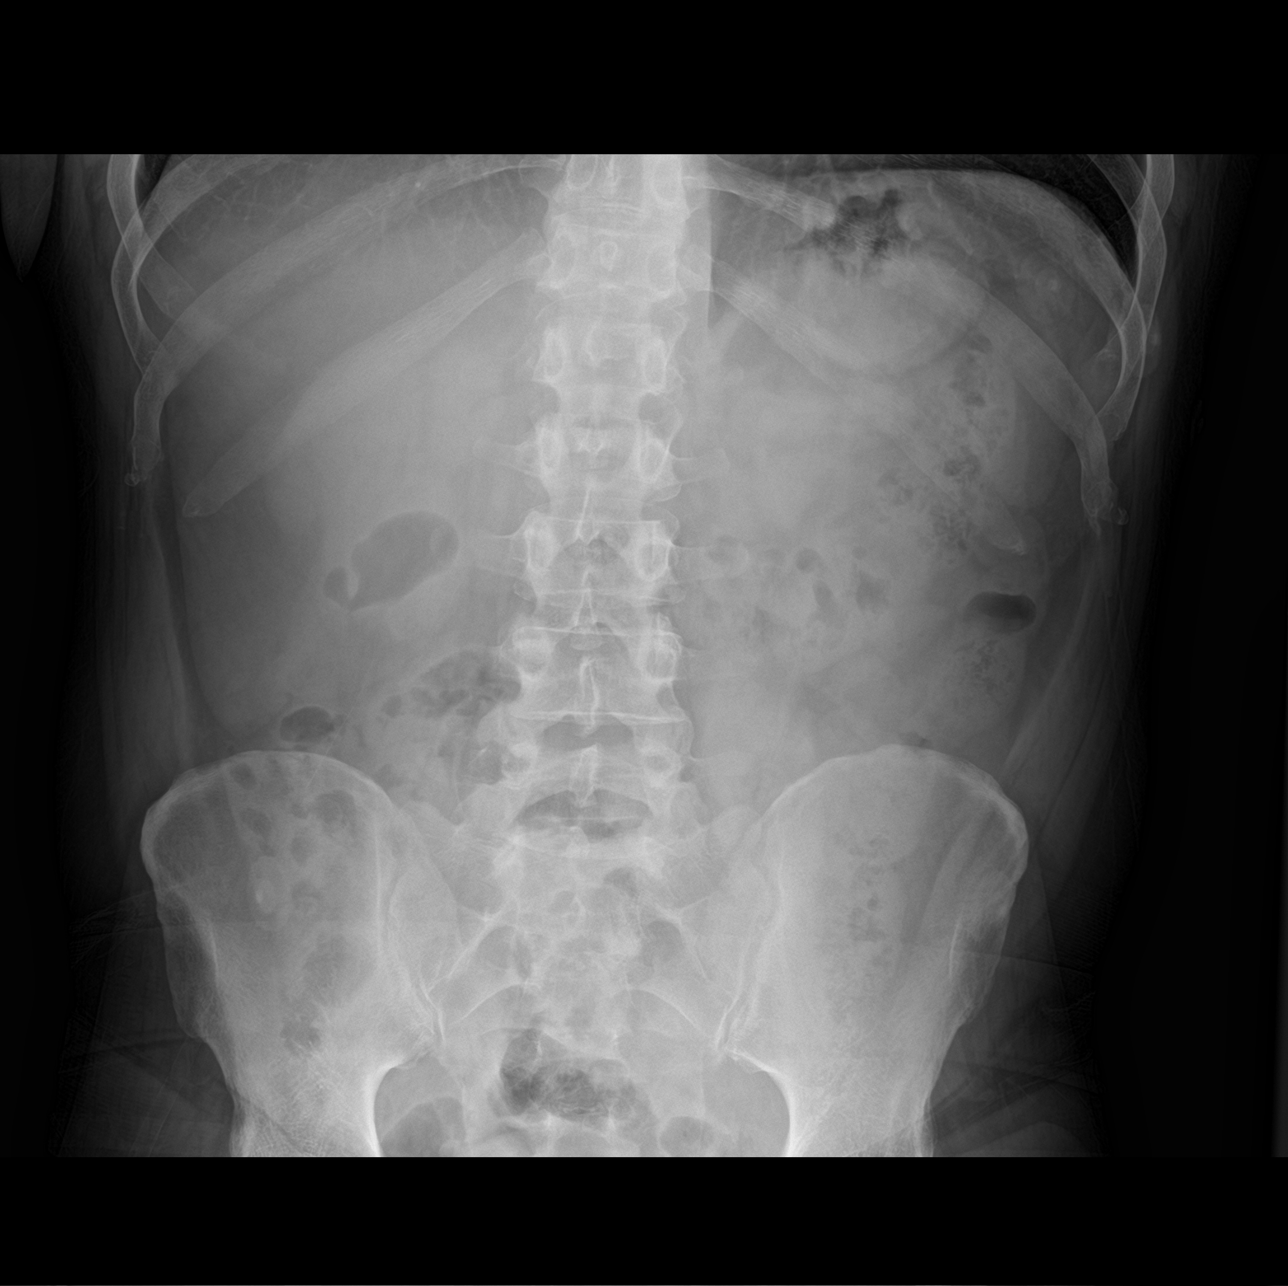

[abdomen supine]
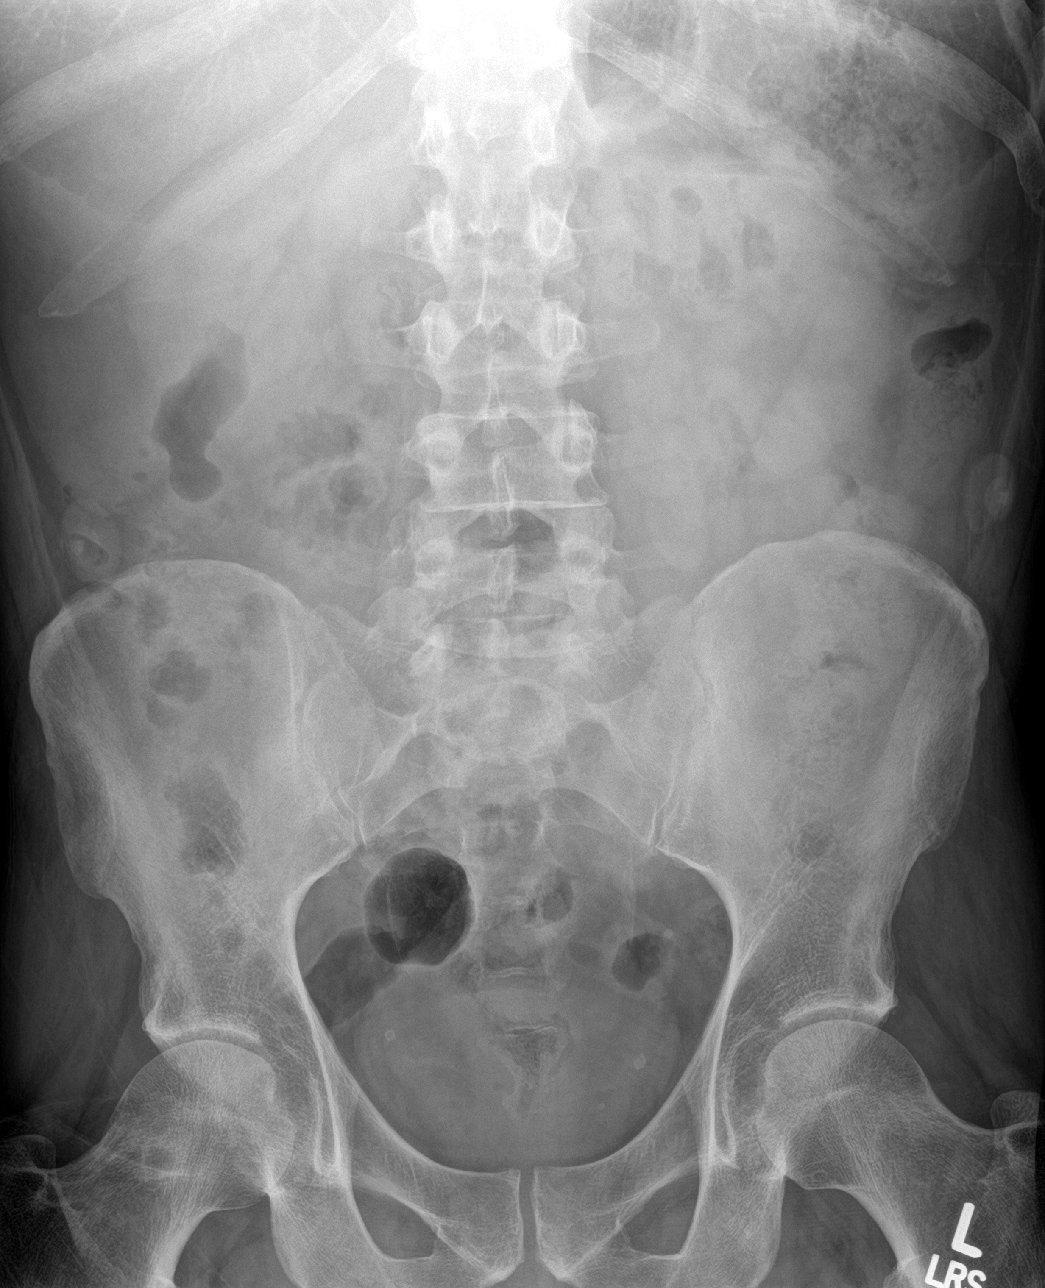

[3 of 3 positions shown; findings below may reference images not displayed]

FINDINGS: The lungs are well-aerated and clear. There is no evidence of focal
opacification, pleural effusion or pneumothorax. The
cardiomediastinal silhouette is within normal limits.

The visualized bowel gas pattern is unremarkable. Scattered stool
and air are seen within the colon; there is no evidence of small
bowel dilatation to suggest obstruction. No free intra-abdominal air
is identified on the provided upright view.

No acute osseous abnormalities are seen; the sacroiliac joints are
unremarkable in appearance.
IMPRESSION: Unremarkable bowel gas pattern; no free intra-abdominal air seen.
Small to moderate amount of stool noted in the colon.
